# Patient Record
Sex: Male | Born: 1995 | Race: White | Hispanic: No | Marital: Single | Smoking: Current every day smoker
Health system: Southern US, Community
[De-identification: ages and names within clinical notes are randomized; demographics above are authoritative.]

---

## 2014-08-18 ENCOUNTER — Emergency Department (HOSPITAL_COMMUNITY)
Admission: EM | Admit: 2014-08-18 | Discharge: 2014-08-18 | Disposition: A | Discharge status: Home / Self Care | Payer: Self-pay | Attending: Emergency Medicine | Admitting: Emergency Medicine

## 2014-08-18 ENCOUNTER — Encounter (HOSPITAL_COMMUNITY): Payer: Self-pay | Admitting: Emergency Medicine

## 2014-08-18 DIAGNOSIS — R112 Nausea with vomiting, unspecified: Secondary | ICD-10-CM | POA: Insufficient documentation

## 2014-08-18 DIAGNOSIS — K2970 Gastritis, unspecified, without bleeding: Secondary | ICD-10-CM | POA: Insufficient documentation

## 2014-08-18 DIAGNOSIS — F17200 Nicotine dependence, unspecified, uncomplicated: Secondary | ICD-10-CM | POA: Insufficient documentation

## 2014-08-18 DIAGNOSIS — K2990 Gastroduodenitis, unspecified, without bleeding: Principal | ICD-10-CM

## 2014-08-18 LAB — COMPREHENSIVE METABOLIC PANEL
ALK PHOS: 43 U/L (ref 39–117)
ALT: 19 U/L (ref 0–53)
ANION GAP: 11 (ref 5–15)
AST: 20 U/L (ref 0–37)
Albumin: 4.2 g/dL (ref 3.5–5.2)
BUN: 10 mg/dL (ref 6–23)
CO2: 26 meq/L (ref 19–32)
Calcium: 9 mg/dL (ref 8.4–10.5)
Chloride: 105 mEq/L (ref 96–112)
Creatinine, Ser: 1.07 mg/dL (ref 0.50–1.35)
GFR calc Af Amer: >90 mL/min (ref >90)
GFR calc non Af Amer: >90 mL/min (ref >90)
Glucose, Bld: 92 mg/dL (ref 70–99)
POTASSIUM: 4.9 meq/L (ref 3.7–5.3)
Sodium: 142 mEq/L (ref 137–147)
TOTAL PROTEIN: 7 g/dL (ref 6.0–8.3)
Total Bilirubin: 0.4 mg/dL (ref 0.3–1.2)

## 2014-08-18 LAB — CBC WITH DIFFERENTIAL/PLATELET
BASOS ABS: 0 10*3/uL (ref 0.0–0.1)
BASOS PCT: 1 % (ref 0–1)
EOS ABS: 0.1 10*3/uL (ref 0.0–0.7)
Eosinophils Relative: 3 % (ref 0–5)
HCT: 42.4 % (ref 39.0–52.0)
HEMOGLOBIN: 14.4 g/dL (ref 13.0–17.0)
Lymphocytes Relative: 34 % (ref 12–46)
Lymphs Abs: 1.8 10*3/uL (ref 0.7–4.0)
MCH: 28.6 pg (ref 26.0–34.0)
MCHC: 34 g/dL (ref 30.0–36.0)
MCV: 84.3 fL (ref 78.0–100.0)
MONOS PCT: 9 % (ref 3–12)
Monocytes Absolute: 0.5 10*3/uL (ref 0.1–1.0)
NEUTROS PCT: 53 % (ref 43–77)
Neutro Abs: 2.9 10*3/uL (ref 1.7–7.7)
Platelets: 239 10*3/uL (ref 150–400)
RBC: 5.03 MIL/uL (ref 4.22–5.81)
RDW: 12.9 % (ref 11.5–15.5)
WBC: 5.3 10*3/uL (ref 4.0–10.5)

## 2014-08-18 LAB — LIPASE, BLOOD: Lipase: 63 U/L — ABNORMAL HIGH (ref 11–59)

## 2014-08-18 MED ORDER — ONDANSETRON 4 MG PO TBDP: 4.0000 mg | ORAL_TABLET | Freq: Three times a day (TID) | ORAL | Status: AC | PRN

## 2014-08-18 MED ORDER — RANITIDINE HCL 150 MG PO CAPS: 150.0000 mg | ORAL_CAPSULE | Freq: Every day | ORAL | Status: AC

## 2014-08-18 NOTE — ED Provider Notes (Signed)
I saw and evaluated the patient, reviewed the resident's note and I agree with the findings and plan.   EKG Interpretation   Date/Time:  Sunday August 18 2014 13:59:05 EDT Ventricular Rate:  84 PR Interval:  122 QRS Duration: 90 QT Interval:  328 QTC Calculation: 387 R Axis:   72 Text Interpretation:  Normal sinus rhythm Normal ECG Confirmed by Jarren Para,   DO, Spiro Ausborn ([id number]) on 08/18/2014 6:04:25 PM      Pt is a 18 y.o. M who presents emergency department 3 weeks of vomiting 30 minutes after eating. He states that he will have some blood-streaked sputum after multiple episodes of vomiting. No diarrhea. No bloody stool or melena. Denies any abdominal pain. Denies any heavy NSAID or alcohol use. No prior abdominal surgery. On exam, patient is well-appearing, smiling, nontoxic. Abdominal exam is completely benign. Lungs are clear to auscultation, heart sounds normal. Denies any chest pain or shortness of breath. No concern for Boerhaave's. Doubt perforated ulcer abdomen benign exam. No peritoneal signs on exam.No hematemesis likely due to esophageal irritation. I feel he will need an endoscopy as an outpatient. We'll start him on H2 blocker and have him followup with a PCP and gastroenterology. Have advised him of what food he should avoid. Have advised him to avoid NSAIDs and alcohol.  Layla Maw Riaz Onorato, DO 08/18/14 1825

## 2014-08-18 NOTE — ED Provider Notes (Signed)
CSN: [id number]     Arrival date & time 08/18/14  1344 History   First MD Initiated Contact with Patient 08/18/14 1726     Chief Complaint  Patient presents with  . Emesis  . Hematemesis  . Dizziness    Patient is a 18 y.o. male presenting with vomiting and dizziness. The history is provided by the patient.  Emesis Duration:  3 weeks Number of daily episodes:  2-3 Quality:  Undigested food (then at end, he notes scant bright red blood) Onset of vomiting after eating: 15-30 minutes. Progression:  Unchanged Chronicity:  New Recent urination:  Normal Relieved by:  None tried Worsened by:  Nothing tried Ineffective treatments:  None tried Associated symptoms: abdominal pain (after vomiting, but not after eating)   Associated symptoms: no chills, no cough, no diarrhea, no fever, no headaches and no URI   Risk factors: alcohol use (rare)   Risk factors: no prior abdominal surgery, no suspect food intake and no travel to endemic areas   Dizziness Context: standing up   Relieved by:  Being still Associated symptoms: vomiting   Associated symptoms: no chest pain, no diarrhea, no headaches, no nausea, no shortness of breath and no syncope    No significant NSAID/ alcohol use.  No pain currently.  No CP or SOB.  No urinary or testicle complaints  History reviewed. No pertinent past medical history. History reviewed. No pertinent past surgical history. No family history on file. History  Substance Use Topics  . Smoking status: Current Every Day Smoker  . Smokeless tobacco: Not on file  . Alcohol Use: No    Review of Systems  Constitutional: Negative for fever and chills.  Respiratory: Negative for cough, shortness of breath and wheezing.   Cardiovascular: Negative for chest pain and syncope.  Gastrointestinal: Positive for vomiting and abdominal pain (after vomiting, but not after eating). Negative for nausea and diarrhea.  Musculoskeletal: Negative for back pain.  Skin: Negative  for rash.  Neurological: Positive for dizziness. Negative for headaches.  All other systems reviewed and are negative.     Allergies  Review of patient's allergies indicates no known allergies.  Home Medications   Prior to Admission medications   Not on File   BP 138/73  Pulse 89  Temp(Src) 98.2 F (36.8 C) (Oral)  Resp 18  SpO2 100% Physical Exam  Nursing note and vitals reviewed. Constitutional: He is oriented to person, place, and time. He appears well-developed and well-nourished. No distress.  HENT:  Head: Normocephalic and atraumatic.  Nose: Nose normal.  Eyes: Conjunctivae are normal.  Neck: Normal range of motion. Neck supple. No tracheal deviation present.  Cardiovascular: Normal rate, regular rhythm and normal heart sounds.   No murmur heard. Pulmonary/Chest: Effort normal and breath sounds normal. No respiratory distress. He has no rales. He exhibits no tenderness.  Abdominal: Soft. Bowel sounds are normal. He exhibits no distension and no mass. There is no tenderness. There is no rebound and no guarding.  Neg Murphys, no CVAT, no McBurneys TTP  Genitourinary:  Normal testicle exam  Musculoskeletal: Normal range of motion. He exhibits no edema.  Neurological: He is alert and oriented to person, place, and time.  Skin: Skin is warm and dry. No rash noted. He is not diaphoretic.  Psychiatric: He has a normal mood and affect.    ED Course  Procedures (including critical care time) Labs Review Labs Reviewed  LIPASE, BLOOD - Abnormal; Notable for the following:  Lipase 63 (*)    All other components within normal limits  COMPREHENSIVE METABOLIC PANEL  CBC WITH DIFFERENTIAL  URINALYSIS, ROUTINE W REFLEX MICROSCOPIC    Imaging Review No results found.   EKG Interpretation   Date/Time:  Sunday August 18 2014 13:59:05 EDT Ventricular Rate:  84 PR Interval:  122 QRS Duration: 90 QT Interval:  328 QTC Calculation: 387 R Axis:   72 Text  Interpretation:  Normal sinus rhythm Normal ECG Confirmed by WARD,   DO, KRISTEN ([id number]) on 08/18/2014 6:04:25 PM      MDM   Final diagnoses:  Gastritis  Non-intractable vomiting with nausea, vomiting of unspecified type    Notes emesis after eating for past 3 weeks.  Currently asymptomatic and well appearing, doubt esophageal rupture/ Booerhaves.  May have had small esophageal mucosal tears from persistent vomiting.  No signs of systemic toxicity or peritonitis to suggest surgical emergency.  Lipase borderline high but may be related to vomiting.   Labs unremarkable, no sign of gallbladder disease.  Notes orthostasis, no signs of anemia or electrolyte derangement.   Could have gastritis vs PUD, doubt perforated ulcer.  Will treat with zantac. F/u with PCP and GI, likely need endoscopy.     Sofie Rower, MD 08/18/14 [id number]

## 2014-08-18 NOTE — Discharge Instructions (Signed)
Gastritis, Adult °Gastritis is soreness and puffiness (inflammation) of the lining of the stomach. If you do not get help, gastritis can cause bleeding and sores (ulcers) in the stomach. °HOME CARE  °· Only take medicine as told by your doctor. °· If you were given antibiotic medicines, take them as told. Finish the medicines even if you start to feel better. °· Drink enough fluids to keep your pee (urine) clear or pale yellow. °· Avoid foods and drinks that make your problems worse. Foods you may want to avoid include: °¨ Caffeine or alcohol. °¨ Chocolate. °¨ Mint. °¨ Garlic and onions. °¨ Spicy foods. °¨ Citrus fruits, including oranges, lemons, or limes. °¨ Food containing tomatoes, including sauce, chili, salsa, and pizza. °¨ Fried and fatty foods. °· Eat small meals throughout the day instead of large meals. °GET HELP RIGHT AWAY IF:  °· You have black or dark red poop (stools). °· You throw up (vomit) blood. It may look like coffee grounds. °· You cannot keep fluids down. °· Your belly (abdominal) pain gets worse. °· You have a fever. °· You do not feel better after 1 week. °· You have any other questions or concerns. °MAKE SURE YOU:  °· Understand these instructions. °· Will watch your condition. °· Will get help right away if you are not doing well or get worse. °Document Released: 05/17/2008 Document Revised: 02/21/2012 Document Reviewed: 01/12/2012 °ExitCare® Patient Information ©2015 [employer]. This information is not intended to replace advice given to you by your health care provider. Make sure you discuss any questions you have with your health care provider. ° ° °Emergency Department Resource Guide °1) Find a Doctor and Pay Out of Pocket °Although you won't have to find out who is covered by your insurance plan, it is a good idea to ask around and get recommendations. You will then need to call the office and see if the doctor you have chosen will accept you as a new patient and what types of  options they offer for patients who are self-pay. Some doctors offer discounts or will set up payment plans for their patients who do not have insurance, but you will need to ask so you aren't surprised when you get to your appointment. ° °2) Contact Your Local Health Department °Not all health departments have doctors that can see patients for sick visits, but many do, so it is worth a call to see if yours does. If you don't know where your local health department is, you can check in your phone book. The CDC also has a tool to help you locate your state's health department, and many state websites also have listings of all of their local health departments. ° °3) Find a Walk-in Clinic °If your illness is not likely to be very severe or complicated, you may want to try a walk in clinic. These are popping up all over the country in pharmacies, drugstores, and shopping centers. They're usually staffed by nurse practitioners or physician assistants that have been trained to treat common illnesses and complaints. They're usually fairly quick and inexpensive. However, if you have serious medical issues or chronic medical problems, these are probably not your best option. ° °No Primary Care Doctor: °- Call [facility] at  [phone] - they can help you locate a primary care doctor that  accepts your insurance, provides certain services, etc. °- Physician Referral Service- 1-800-533-3463 ° °Chronic Pain Problems: °Organization         Address    Phone   Notes  °[facility]  ([phone]) [phone] Patients need to be referred by their primary care doctor.  ° °Medication Assistance: °Organization         Address  Phone   Notes  °[place] [facility] [address] °[place], [place] [address] ([phone]) 641-8030 --Must be a resident of [place] °-- Must have NO insurance coverage whatsoever (no Medicaid/ Medicare, etc.) °-- The pt. MUST have a primary care doctor that directs  their care regularly and follows them in the community °  °[facility]  (866) 331-1348   °[employer]  (888) 892-1162   ° °Agencies that provide inexpensive medical care: °Organization         Address  Phone   Notes  °[facility]  ([phone]) [phone]   °[facility]    ([phone]) 832-7272   °[facility] [address] °[place], [place] [address] ([phone]) 832-4777   °[facility] [address], °[place] (336) 271-4999   °[facility]    (336) 373-0678   °[facility]    (336) 272-1050   °[facility] ° [address], [place] Phone:  (336) 832-4444, Fax:  ([phone]) [phone] Hours of Operation:  9 am - 6 pm, M-F.  Also accepts Medicaid/Medicare and self-pay.  °[facility] ° [address], [place] Phone: ([phone]) [phone], Fax: ([phone]) [phone]. Hours of Operation:  8:30 am - 5:30 pm, M-F.  Also accepts Medicaid and self-pay.  °[facility] [place] [address], [place] Phone: ([phone]) [phone]   °[facility] [address], [place], [place] (336)[phone], Ext. 123 Mondays & Thursdays: 7-9 AM.  First 15 patients are seen on a first come, first serve basis. °  ° °Medicaid-accepting [place] Providers: ° °Organization         Address  Phone   Notes  °[facility] [address], [place] ([phone]) [phone] Also accepts self-pay patients.  °[facility] [address], [place] ° (336) [phone]   °[facility] [address], [place] ([phone]) [phone]   °[facility] [address] [place] [address], [place] (336) [phone]   °[facility] [address], [place]  ° ([phone]) [phone] Only accepts [place] Access Medicaid patients after they have their name applied to their card.  ° °Self-Pay (no insurance) in [place]: ° °Organization          Address  Phone   Notes  °Sickle Cell Patients, [facility] [address], [place] (336) [phone]   °[facility] [address], [place] (336) [phone]   °[facility] ° [address], [place] (336) [phone]   °[facility]/Dr. Osei-Bonsu ° [address] [place] [address], [place] or [address], [place] [phone]) [phone] Phone number for both [place] and [place] locations is the same.  °[facility] [address], [place] ([phone]) [phone]   °[facility]  [address] [place] [address],  or [address] ([phone]) [phone] °(336) 878-2260   °[facility] [address],  (336) 350-1642, phone; ([phone]) [phone], fax Sees patients 1st and 3rd Saturday of every month.  Must not qualify for public   or private insurance (i.e. Medicaid, Medicare, [place] Health Choice, Veterans' Benefits)  Household income should be no more than 200% of the poverty level The clinic cannot treat you if you are pregnant or think you are pregnant  Sexually transmitted diseases are not treated at the clinic.    Dental Care: Organization         Address  Phone  Notes  [facility] [address] [place], [place] [phone] Accepts children up to age 74 who are enrolled in [place] or [place] Health Choice; pregnant women with a Medicaid card; and children who have applied for Medicaid or [place] Health Choice, but were declined, whose parents can pay a reduced fee at time of service.  [facility] of [facility]  [address], [place] [phone] Accepts children up to age 11 who are enrolled in [place] or [place] Health Choice; pregnant women with a Medicaid card; and children who have applied for Medicaid or [place] Health Choice, but were declined, whose parents can pay a reduced fee at time of  service.  [facility]  [address] [place], [place] [phone] Patients are seen by appointment only. Walk-ins are not accepted. [facility] will see patients 87 years of age and older. Monday - Tuesday (8am-5pm) Most Wednesdays (8:30-5pm) $30 per visit, cash only  [facility]  [address], [facility] [phone] Patients are seen by appointment only. Walk-ins are not accepted. [facility] will see patients 67 years of age and older. One Wednesday Evening (Monthly: Volunteer Based).  $30 per visit, cash only  [employer]  (214)560-8617 for adults; Children under age 13, call Graduate Pediatric Dentistry at [phone]. Children aged 57-14, please call [phone] to request a pediatric application.  Dental services are provided in all areas of dental care including fillings, crowns and bridges, complete and partial dentures, implants, gum treatment, root canals, and extractions. Preventive care is also provided. Treatment is provided to both adults and children. Patients are selected via a lottery and there is often a waiting list.   [facility] [address], [place]  [phone] [email or web address]   [facility] [address] [place], [place] [phone], Ext. [phone] Second and Fourth Thursday of each month, opens at 6:30 AM; Clinic ends at 9 AM.  Patients are seen on a first-come first-served basis, and a limited number are seen during each clinic.   [facility]  [address] [place], [place] [phone]   Eligibility Requirements You must have lived in [place], [place], or [place] counties for at least the last three months.   You cannot be eligible for state or federal sponsored [place], including [employer], [place], or [employer].   You generally cannot be eligible for healthcare insurance through your employer.     How to apply: Eligibility screenings are held every Tuesday and Wednesday afternoon from 1:00 pm until 4:00 pm. You do not need an appointment for the interview!  [facility] [address], [place], [place] [phone]   [facility]  [phone]   [facility]  [phone]   [facility]  [phone]

## 2014-08-18 NOTE — ED Notes (Signed)
Pt states for the last month he has been vomiting after he eats everytime and at the end there is some blood.  Pt states for the last year he has been having his heart rate get fast and when he stands feels like he is going to pass out , but not today.  Pt reports left upper quad pain

## 2014-08-18 NOTE — ED Notes (Signed)
Pt given sprite for PO challenge

## 2014-08-22 ENCOUNTER — Emergency Department (HOSPITAL_COMMUNITY): Payer: Self-pay

## 2014-08-22 ENCOUNTER — Encounter (HOSPITAL_COMMUNITY): Payer: Self-pay | Admitting: Emergency Medicine

## 2014-08-22 ENCOUNTER — Emergency Department (HOSPITAL_COMMUNITY)
Admission: EM | Admit: 2014-08-22 | Discharge: 2014-08-22 | Disposition: A | Discharge status: Home / Self Care | Payer: Self-pay | Attending: Emergency Medicine | Admitting: Emergency Medicine

## 2014-08-22 DIAGNOSIS — Z79899 Other long term (current) drug therapy: Secondary | ICD-10-CM | POA: Insufficient documentation

## 2014-08-22 DIAGNOSIS — Z7982 Long term (current) use of aspirin: Secondary | ICD-10-CM | POA: Insufficient documentation

## 2014-08-22 DIAGNOSIS — K219 Gastro-esophageal reflux disease without esophagitis: Secondary | ICD-10-CM | POA: Insufficient documentation

## 2014-08-22 DIAGNOSIS — F17200 Nicotine dependence, unspecified, uncomplicated: Secondary | ICD-10-CM | POA: Insufficient documentation

## 2014-08-22 DIAGNOSIS — R1111 Vomiting without nausea: Secondary | ICD-10-CM

## 2014-08-22 DIAGNOSIS — R109 Unspecified abdominal pain: Secondary | ICD-10-CM | POA: Insufficient documentation

## 2014-08-22 LAB — URINE MICROSCOPIC-ADD ON

## 2014-08-22 LAB — URINALYSIS, ROUTINE W REFLEX MICROSCOPIC
Bilirubin Urine: NEGATIVE
Glucose, UA: NEGATIVE mg/dL
Hgb urine dipstick: NEGATIVE
Ketones, ur: NEGATIVE mg/dL
Leukocytes, UA: NEGATIVE
Nitrite: NEGATIVE
PH: 7 (ref 5.0–8.0)
PROTEIN: NEGATIVE mg/dL
Specific Gravity, Urine: 1.024 (ref 1.005–1.030)
Urobilinogen, UA: 0.2 mg/dL (ref 0.0–1.0)

## 2014-08-22 LAB — CBC WITH DIFFERENTIAL/PLATELET
BASOS PCT: 0 % (ref 0–1)
Basophils Absolute: 0 10*3/uL (ref 0.0–0.1)
Eosinophils Absolute: 0.2 10*3/uL (ref 0.0–0.7)
Eosinophils Relative: 3 % (ref 0–5)
HCT: 43 % (ref 39.0–52.0)
HEMOGLOBIN: 14.8 g/dL (ref 13.0–17.0)
LYMPHS ABS: 2.6 10*3/uL (ref 0.7–4.0)
LYMPHS PCT: 36 % (ref 12–46)
MCH: 28.4 pg (ref 26.0–34.0)
MCHC: 34.4 g/dL (ref 30.0–36.0)
MCV: 82.4 fL (ref 78.0–100.0)
MONOS PCT: 10 % (ref 3–12)
Monocytes Absolute: 0.7 10*3/uL (ref 0.1–1.0)
NEUTROS ABS: 3.7 10*3/uL (ref 1.7–7.7)
NEUTROS PCT: 51 % (ref 43–77)
Platelets: 269 10*3/uL (ref 150–400)
RBC: 5.22 MIL/uL (ref 4.22–5.81)
RDW: 12.9 % (ref 11.5–15.5)
WBC: 7.2 10*3/uL (ref 4.0–10.5)

## 2014-08-22 LAB — COMPREHENSIVE METABOLIC PANEL
ALBUMIN: 4.2 g/dL (ref 3.5–5.2)
ALT: 22 U/L (ref 0–53)
ANION GAP: 12 (ref 5–15)
AST: 20 U/L (ref 0–37)
Alkaline Phosphatase: 45 U/L (ref 39–117)
BUN: 14 mg/dL (ref 6–23)
CALCIUM: 9.1 mg/dL (ref 8.4–10.5)
CO2: 26 mEq/L (ref 19–32)
Chloride: 100 mEq/L (ref 96–112)
Creatinine, Ser: 0.99 mg/dL (ref 0.50–1.35)
GFR calc Af Amer: >90 mL/min (ref >90)
GFR calc non Af Amer: >90 mL/min (ref >90)
Glucose, Bld: 89 mg/dL (ref 70–99)
Potassium: 4 mEq/L (ref 3.7–5.3)
SODIUM: 138 meq/L (ref 137–147)
TOTAL PROTEIN: 7.1 g/dL (ref 6.0–8.3)
Total Bilirubin: 0.3 mg/dL (ref 0.3–1.2)

## 2014-08-22 LAB — LIPASE, BLOOD: Lipase: 51 U/L (ref 11–59)

## 2014-08-22 MED ORDER — OMEPRAZOLE 20 MG PO CPDR: 20.0000 mg | DELAYED_RELEASE_CAPSULE | Freq: Every day | ORAL | Status: AC

## 2014-08-22 MED ORDER — ONDANSETRON HCL 4 MG/2ML IJ SOLN
4.0000 mg | Freq: Once | INTRAMUSCULAR | Status: AC
  Administered 2014-08-22: 4 mg via INTRAVENOUS
  Filled 2014-08-22: qty 2

## 2014-08-22 MED ORDER — IOHEXOL 300 MG/ML  SOLN: 25.0000 mL | Freq: Once | INTRAMUSCULAR | Status: DC | PRN

## 2014-08-22 MED ORDER — IOHEXOL 300 MG/ML  SOLN
100.0000 mL | Freq: Once | INTRAMUSCULAR | Status: AC | PRN
  Administered 2014-08-22: 100 mL via INTRAVENOUS

## 2014-08-22 MED ORDER — PANTOPRAZOLE SODIUM 40 MG IV SOLR
40.0000 mg | Freq: Once | INTRAVENOUS | Status: AC
  Administered 2014-08-22: 40 mg via INTRAVENOUS
  Filled 2014-08-22: qty 40

## 2014-08-22 NOTE — Discharge Instructions (Signed)
°Emergency Department Resource Guide °1) Find a Doctor and Pay Out of Pocket °Although you won't have to find out who is covered by your insurance plan, it is a good idea to ask around and get recommendations. You will then need to call the office and see if the doctor you have chosen will accept you as a new patient and what types of options they offer for patients who are self-pay. Some doctors offer discounts or will set up payment plans for their patients who do not have insurance, but you will need to ask so you aren't surprised when you get to your appointment. ° °2) Contact Your Local Health Department °Not all health departments have doctors that can see patients for sick visits, but many do, so it is worth a call to see if yours does. If you don't know where your local health department is, you can check in your phone book. The CDC also has a tool to help you locate your state's health department, and many state websites also have listings of all of their local health departments. ° °3) Find a Walk-in Clinic °If your illness is not likely to be very severe or complicated, you may want to try a walk in clinic. These are popping up all over the country in pharmacies, drugstores, and shopping centers. They're usually staffed by nurse practitioners or physician assistants that have been trained to treat common illnesses and complaints. They're usually fairly quick and inexpensive. However, if you have serious medical issues or chronic medical problems, these are probably not your best option. ° °No Primary Care Doctor: °- Call [facility] at  832-8000 - they can help you locate a primary care doctor that  accepts your insurance, provides certain services, etc. °- Physician Referral Service- 1-800-533-3463 ° °Chronic Pain Problems: °Organization         Address  Phone   Notes  °[facility]  (336) 297-2271 Patients need to be referred by their primary care doctor.  ° °Medication  Assistance: °Organization         Address  Phone   Notes  °[place] [facility] [address] °[place], [place] [address] (336) 641-8030 --Must be a resident of [place] °-- Must have NO insurance coverage whatsoever (no Medicaid/ Medicare, etc.) °-- The pt. MUST have a primary care doctor that directs their care regularly and follows them in the community °  °[facility]  (866) 331-1348   °[employer]  (888) 892-1162   ° °Agencies that provide inexpensive medical care: °Organization         Address  Phone   Notes  °[facility]  (336) 832-8035   °[facility]    (336) 832-7272   °[facility] [address] °[place], [place] [address] (336) 832-4777   °[facility] [address], °[place] (336) 271-4999   °[facility]    (336) 373-0678   °[facility]    (336) 272-1050   °[facility] ° [address], [place] Phone:  (336) 832-4444, Fax:  (336) 832-4440 Hours of Operation:  9 am - 6 pm, M-F.  Also accepts Medicaid/Medicare and self-pay.  °[facility] ° [address], [place] Phone: (336) 832-3150, Fax: (336) [phone]. Hours of Operation:  8:30 am - 5:30 pm, M-F.  Also accepts Medicaid and self-pay.  °[facility] [place] [address], [place] Phone: (336) 878-6027   °[facility] [address], [place], [place] (336)723-1848, Ext. 123 Mondays & Thursdays: 7-9 AM.  First 15 patients are seen on a first come, first serve basis. °  ° °Medicaid-accepting [place] Providers: ° °Organization         Address  Phone   Notes  °[facility] [address], [place] (336) 641-2100 Also accepts self-pay patients.  °[facility] [address], [place] ° (336) 856-9996   °[facility] [address], [place]  (336) 288-8857   °[facility] [address] [place] [address], [place] (336) 299-7000   °Veita Bland [address], [place]  ° (336) 373-1557 Only accepts [place] Access Medicaid patients after they have their name applied to their card.  ° °Self-Pay (no insurance) in [place]: ° °Organization         Address  Phone   Notes  °Sickle Cell Patients, [facility] [address], [place] (336) 832-1970   °[facility] [address], [place] (336) 832-4400   °[facility] ° [address], [place] (336) 992-4800   °[facility]/Dr. Osei-Bonsu ° [address] [place] [address], [place] or [address], [place] (336) [phone] Phone number for both [place] and [place] locations is the same.  °[facility] [address], [place] (336) 299-0000   °[facility] [address] [place] [address], [place] or [address] (336) 852-7530 °(336) 878-2260   °[facility] [address], [place] (336) 350-1642, phone; (336) 294-5005, fax Sees patients 1st and 3rd Saturday of every month.  Must not qualify for public or private insurance (i.e. Medicaid, Medicare, [place] Health Choice, Veterans' Benefits) • Household income should be no more than 200% of the poverty level •The clinic cannot treat you if you are pregnant or think you are pregnant • Sexually transmitted diseases are not treated at the clinic.  ° ° °Dental Care: °Organization         Address  Phone  Notes  °[place] [facility] [address], [place] (336) 641-6152 Accepts children up to age 21 who are enrolled in Medicaid or [place] Health Choice; pregnant women with a Medicaid card; and children who have applied for Medicaid or [place] Health Choice, but were declined, whose parents can pay a reduced fee at time of service.  °[place]  [facility] [place]  [address], [place] (336) 641-7733 Accepts children up to age 21 who are enrolled in Medicaid or [place] Health Choice; pregnant women with a Medicaid card; and children who have applied for Medicaid or [place] Health Choice, but were declined, whose parents can pay a reduced fee at time of service.  °[facility] ° [address], [place] (336) 641-4533 Patients are seen by appointment only. Walk-ins are not accepted. [facility] will see patients 18 years of age and older. °Monday - Tuesday (8am-5pm) °Most Wednesdays (8:30-5pm) °$30 per visit, cash only  °[facility] ° [address], [place] (336) 641-4533 Patients are seen by appointment only. Walk-ins are not accepted. [facility] will see patients 18 years of age and older. °One   Wednesday Evening (Monthly: Volunteer Based).  $30 per visit, cash only  °[facility]  (919) 537-3737 for adults; Children under age 4, call Graduate Pediatric Dentistry at ([phone]) [phone]. Children aged 4-14, please call (919) [phone] to request a pediatric application. ° Dental services are provided in all areas of dental care including fillings, crowns and bridges, complete and partial dentures, implants, gum treatment, root canals, and extractions. Preventive care is also provided. Treatment is provided to both adults and children. °Patients are selected via a lottery and there is often a waiting list. °  °[facility] [address], °[place] ° (336) 763-8833 [email or web address] °  °[facility] [address], [place], [place] (336)723-1848, Ext. 123 Second and Fourth Thursday of each month, opens at 6:30 AM; Clinic ends at 9 AM.  Patients are seen on a first-come first-served basis, and a limited number are seen during each clinic.  ° °[facility] ° [address], [place], [place] (336) [phone]    Eligibility Requirements °You must have lived in [place], [place], or [place] counties for at least the last three months. °  You cannot be eligible for state or federal sponsored healthcare insurance, including Veterans Administration, Medicaid, or Medicare. °  You generally cannot be eligible for healthcare insurance through your employer.  °  How to apply: °Eligibility screenings are held every Tuesday and Wednesday afternoon from 1:00 pm until 4:00 pm. You do not need an appointment for the interview!  °[facility] [address], [place], [place] 336-631-2330   °[facility]  336-342-8273   °[facility]  336-703-3100   °[facility]  336-570-6415   ° °Behavioral Health Resources in the Community: °Intensive Outpatient Programs °Organization         Address  Phone  Notes  °[facility] [address], [place], [place] 336-878-6098   °[facility] [address], [place], [place] 336-832-9800   °[facility] [address], [place], [place] ° 336-882-2125   °[facility] [address],  °[place], [place] 1-800-853-5163 or 336-641-4981   °Substance Abuse Resources °Organization         Address  Phone  Notes  °[facility]  336-882-2125   °[facility]  336-784-9470   °[facility]  336-285-9073   °[facility]  336-845-3988   °Residential & Outpatient Substance Abuse Program  1-800-659-3381   °Psychological Services °Organization         Address  Phone  Notes  °[facility]  336- 832-9600   °[facility]  336- 378-7881   °[facility] [address], [place] 1-800-853-5163 or 336-641-4981   ° °Mobile Crisis Teams °Organization         Address  Phone  Notes  °[facility], Mobile Crisis Care Unit  1-877-626-1772   °Assertive °[facility] ° [address]  [place], [place] 336-834-9664   °Sharon DeEsch [address] °[place] 336-554-5454   ° °Self-Help/Support Groups °Organization         Address  Phone             Notes  °[facility]  - variety of support groups  336- 373-1402 Call for more information  °Narcotics Anonymous (NA), [facility] [address], °[place]  2 meetings at this location  ° °  Residential Treatment Programs Organization         Address  Phone  Notes  [facility] [address]    [place]  1-308-657-8469   [facility]  [address] [place] [address], [place], [place] 413-244-0102   [facility] [address] [place], [place] 725-366-4403 Admissions: 8am-3pm M-F  [facility] [address]    [place], [place] 474-259-5638   [facility] [address] [place], [place], [place] 756-433-2951   [facility] [address],  [place], [place] [phone]   [facility] Intensive Outpatient [address], [place], [place] 160-109-3235   [facility] ([facility] [address] [place].,  [place], [place] 5-732-202-5427 or 952-406-5978   [facility] ([facility] [address] [place], [place] 517-616-0737 Accepts Medicaid  [facility] [address]  [place] 1-062-694-8546 Substance Abuse/Addiction Treatment   [facility]         Address  Phone  Notes  [facility]  281 110 6244   Angie Fava, PhD [address] [place] [place], [place]   6607852027 or 640 691 8021   [facility]   [address] [place], [place] 364-061-8475   [facility] [address], [place], [place] [phone] Insurance/Medicaid/sponsorship through [facility] [address]                                    [place], [place] [phone] Therapy/tele-psych/case    [facility] [address], [place] [phone]    Dr. Lolly Mustache  [phone]   [facility]  [employer] [facility] Dept. 1) [address], [place] 2) [address], [place] 3)  [address], [place] (514)[phone]  (331) [phone]   [facility] Child Abuse Hotline (307)226-4351 or (909)615-1900 (After Hours)     Gastroesophageal Reflux Disease, Adult Gastroesophageal reflux disease (GERD) happens when acid from your stomach flows up into the esophagus. When acid comes in contact with the esophagus, the acid causes soreness (inflammation) in the esophagus. Over time, GERD may create small holes (ulcers) in the lining of the esophagus. CAUSES   Increased body weight. This puts pressure on the stomach, making acid rise from the stomach into the esophagus.  Smoking. This increases acid production in the stomach.  Drinking alcohol. This causes decreased pressure in the lower esophageal sphincter (valve or ring of muscle between the esophagus and stomach), allowing acid from the stomach into the esophagus.  Late evening meals and a full stomach. This increases pressure and acid production in the stomach.  A malformed lower esophageal sphincter. Sometimes, no cause is found. SYMPTOMS   Burning pain in the lower part of the mid-chest behind the breastbone and in the mid-stomach area. This may occur twice a week or more often.  Trouble swallowing.  Sore throat.  Dry cough.  Asthma-like symptoms including chest tightness, shortness of breath, or wheezing. DIAGNOSIS  Your caregiver may be able to diagnose GERD based on your symptoms. In some cases, X-rays and other tests may be done to check for complications or to check the condition of your stomach and esophagus. TREATMENT  Your caregiver may recommend over-the-counter or prescription  medicines to help decrease acid production. Ask your caregiver before  starting or adding any new medicines.  HOME CARE INSTRUCTIONS   Change the factors that you can control. Ask your caregiver for guidance concerning weight loss, quitting smoking, and alcohol consumption.  Avoid foods and drinks that make your symptoms worse, such as:  Caffeine or alcoholic drinks.  Chocolate.  Peppermint or mint flavorings.  Garlic and onions.  Spicy foods.  Citrus fruits, such as oranges, lemons, or limes.  Tomato-based foods such as sauce, chili, salsa, and pizza.  Fried and fatty foods.  Avoid lying down for the 3 hours prior to your bedtime or prior to taking a nap.  Eat small, frequent meals instead of large meals.  Wear loose-fitting clothing. Do not wear anything tight around your waist that causes pressure on your stomach.  Raise the head of your bed 6 to 8 inches with wood blocks to help you sleep. Extra pillows will not help.  Only take over-the-counter or prescription medicines for pain, discomfort, or fever as directed by your caregiver.  Do not take aspirin, ibuprofen, or other nonsteroidal anti-inflammatory drugs (NSAIDs). SEEK IMMEDIATE MEDICAL CARE IF:   You have pain in your arms, neck, jaw, teeth, or back.  Your pain increases or changes in intensity or duration.  You develop nausea, vomiting, or sweating (diaphoresis).  You develop shortness of breath, or you faint.  Your vomit is green, yellow, black, or looks like coffee grounds or blood.  Your stool is red, bloody, or black. These symptoms could be signs of other problems, such as heart disease, gastric bleeding, or esophageal bleeding. MAKE SURE YOU:   Understand these instructions.  Will watch your condition.  Will get help right away if you are not doing well or get worse. Document Released: 09/08/2005 Document Revised: 02/21/2012 Document Reviewed: 06/18/2011 [facility] Patient Information 2015 [place], [place]. This information is not intended to replace advice given to  you by your health care provider. Make sure you discuss any questions you have with your health care provider.

## 2014-08-22 NOTE — [employer] Notes (Signed)
[facility] [employer],   Provided pt with a list of primary care resources, [employer], and [employer].

## 2014-08-22 NOTE — ED Provider Notes (Signed)
CSN: [id number]     Arrival date & time 08/22/14  1400 History   First MD Initiated Contact with Patient 08/22/14 1745     Chief Complaint  Patient presents with  . Abdominal Pain     (Consider location/radiation/quality/duration/timing/severity/associated sxs/prior Treatment) HPI This patient presents with a 3-4 week history of epigastric pain and vomiting. The patient reports that after he eats about 30-40 minutes later he starts vomiting. He has not had problems with his bowel movements or any diarrhea. He describes an aching sharp epigastric pain that then follows this. Gradually that starts to go away. He doesn't seem a particular food triggers that he reports he can be anything that he might eat or drink. The patient reports he has had weight loss but doesn't quantify. He denies fever chills or other general constitutional symptoms.  History reviewed. No pertinent past medical history. History reviewed. No pertinent past surgical history. No family history on file. History  Substance Use Topics  . Smoking status: Current Every Day Smoker  . Smokeless tobacco: Not on file  . Alcohol Use: No    Review of Systems  10 Systems reviewed and are negative for acute change except as noted in the HPI.   Allergies  Review of patient's allergies indicates no known allergies.  Home Medications   Prior to Admission medications   Medication Sig Start Date End Date Taking? Authorizing Provider  Aspirin-Acetaminophen-Caffeine (EXCEDRIN EXTRA STRENGTH PO) Take 2 tablets by mouth.   Yes Historical Provider, MD  omeprazole (PRILOSEC) 20 MG capsule Take 1 capsule (20 mg total) by mouth daily. 08/22/14   Arby Barrette, MD  ondansetron (ZOFRAN ODT) 4 MG disintegrating tablet Take 1 tablet (4 mg total) by mouth every 8 (eight) hours as needed for nausea or vomiting. 08/18/14   Sofie Rower, MD  ranitidine (ZANTAC) 150 MG capsule Take 1 capsule (150 mg total) by mouth daily. 08/18/14   Sofie Rower,  MD   BP 120/61  Pulse 63  Temp(Src) 98 F (36.7 C) (Oral)  Resp 16  SpO2 100% Physical Exam  Constitutional: He is oriented to person, place, and time. He appears well-developed and well-nourished.  HENT:  Head: Normocephalic and atraumatic.  Eyes: EOM are normal. Pupils are equal, round, and reactive to light.  Neck: Neck supple.  Cardiovascular: Normal rate, regular rhythm, normal heart sounds and intact distal pulses.   Pulmonary/Chest: Effort normal and breath sounds normal.  Abdominal: Soft. Bowel sounds are normal. He exhibits no distension. There is no tenderness.  Musculoskeletal: Normal range of motion. He exhibits no edema.  Neurological: He is alert and oriented to person, place, and time. He has normal strength. Coordination normal. GCS eye subscore is 4. GCS verbal subscore is 5. GCS motor subscore is 6.  Skin: Skin is warm, dry and intact.  Psychiatric: He has a normal mood and affect.     ED Course  Procedures (including critical care time) Labs Review Labs Reviewed  URINALYSIS, ROUTINE W REFLEX MICROSCOPIC - Abnormal; Notable for the following:    APPearance TURBID (*)    All other components within normal limits  URINE MICROSCOPIC-ADD ON - Abnormal; Notable for the following:    Bacteria, UA FEW (*)    All other components within normal limits  COMPREHENSIVE METABOLIC PANEL  CBC WITH DIFFERENTIAL  LIPASE, BLOOD    Imaging Review Ct Abdomen Pelvis W Contrast  08/22/2014   CLINICAL DATA:  Left-sided abdominal pain.  Nausea and vomiting.  EXAM: CT ABDOMEN  AND PELVIS WITH CONTRAST  TECHNIQUE: Multidetector CT imaging of the abdomen and pelvis was performed using the standard protocol following bolus administration of intravenous contrast.  CONTRAST:  OMNIPAQUE IOHEXOL 300 MG/ML  SOLN  COMPARISON:  None.  FINDINGS: Liver:  No masses or other significant abnormality identified.  Gallbladder/Biliary:  Unremarkable.  Pancreas: No mass, inflammatory changes, or  other parenchymal abnormality identified.  Spleen:  Within normal limits in size and appearance.  Adrenal Glands:  No mass identified.  Kidneys/Urinary Tract: No masses identified. No evidence of hydronephrosis.  Lymph Nodes:  No pathologically enlarged lymph nodes identified.  Pelvic/Reproductive Organs: No mass or other significant abnormality identified.  Bowel/Peritoneum: No evidence of bowel wall thickening, mass, or obstruction.  Vascular:  No evidence of abdominal aortic aneurysm.  Musculoskeletal:  No suspicious bone lesions identified.  Other:  None.  IMPRESSION: Negative.   Electronically Signed   By: Myles Rosenthal M.D.   On: 08/22/2014 20:37     EKG Interpretation None      MDM   Final diagnoses:  Gastroesophageal reflux disease without esophagitis  Non-intractable vomiting without nausea, vomiting of unspecified type   At this time for the workup is negative. CT does not show any acute obstructive pattern or other surgical etiology. Patient will be treated for reflux gastritis type symptoms. He apparently was unable to afford the medications previously prescribed and has been counseled for over-the-counter Prilosec and taking that. He is also counseled on the need to get outpatient followup and probable endoscopy.    Arby Barrette, MD 08/22/14 2056

## 2014-08-22 NOTE — ED Notes (Signed)
Pt aware urine sample is needed 

## 2014-08-22 NOTE — ED Notes (Signed)
Pt c/o abd pain x 1 month, states 15-30 mins after eating he has abd pain and vomiting. States there is blood in vomit too. Pt was seen at [facility] 9/6 and diagnosed with gastritis.

## 2016-04-01 IMAGING — CT CT ABD-PELV W/ CM
1 series · 15 of 32 positions shown, 19 images · IV contrast (OMNIPAQUE 300)
Comparison: None.

CLINICAL DATA: Left-sided abdominal pain.  Nausea and vomiting.

EXAM:
CT ABDOMEN AND PELVIS WITH CONTRAST
TECHNIQUE: Multidetector CT imaging of the abdomen and pelvis was performed
using the standard protocol following bolus administration of
intravenous contrast.
CONTRAST:  100mL OMNIPAQUE IOHEXOL 300 MG/ML  SOLN

[Series 2: abd/pel with · axial · 0.74mm/px · z∈[+1104,+1564]mm · 15 of 103 slices shown, 19 images]
[im 7/103  soft-tissue]
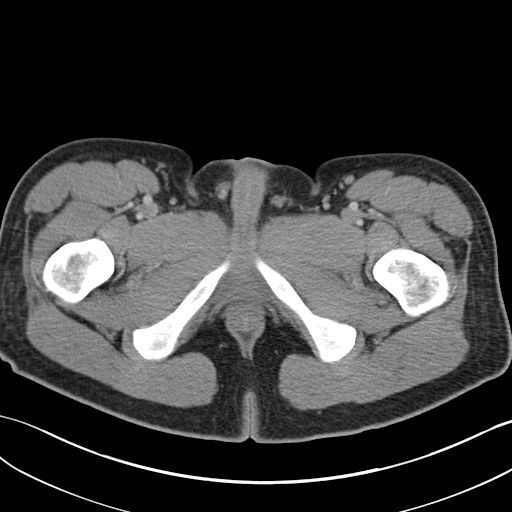
[im 7/103  bone]
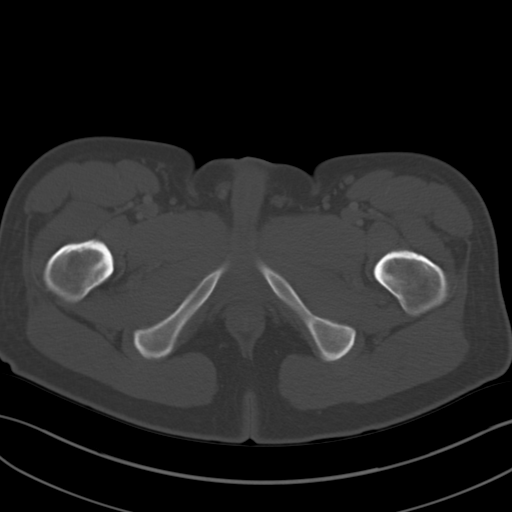
[im 14/103  soft-tissue]
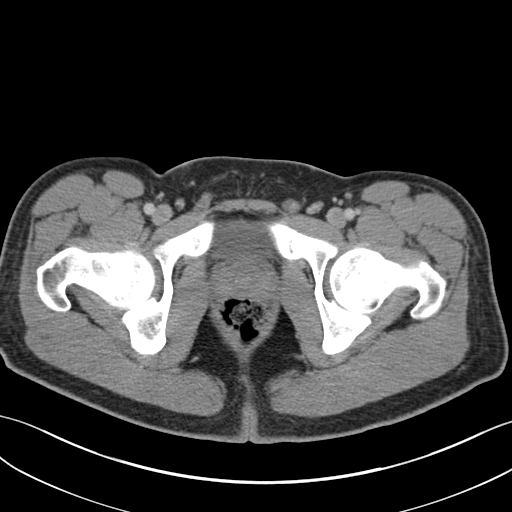
[im 20/103  soft-tissue]
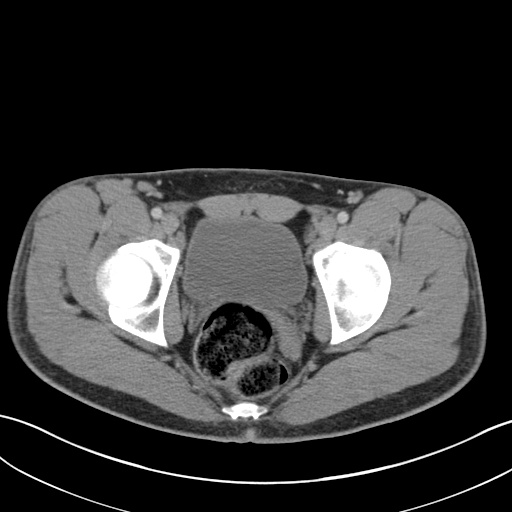
[im 30/103  soft-tissue]
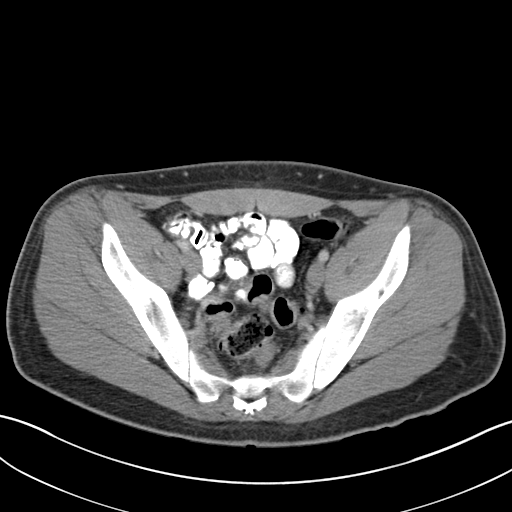
[im 37/103  soft-tissue]
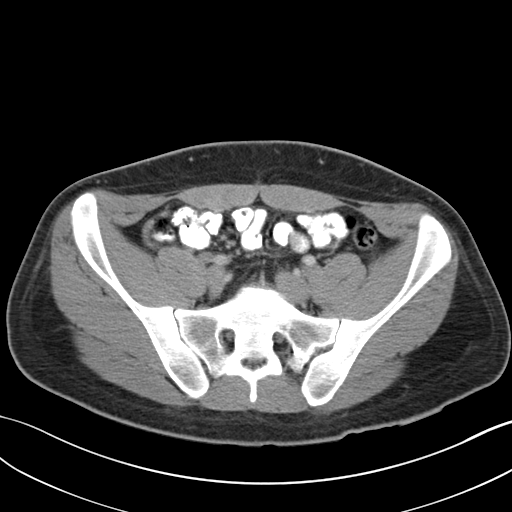
[im 43/103  soft-tissue]
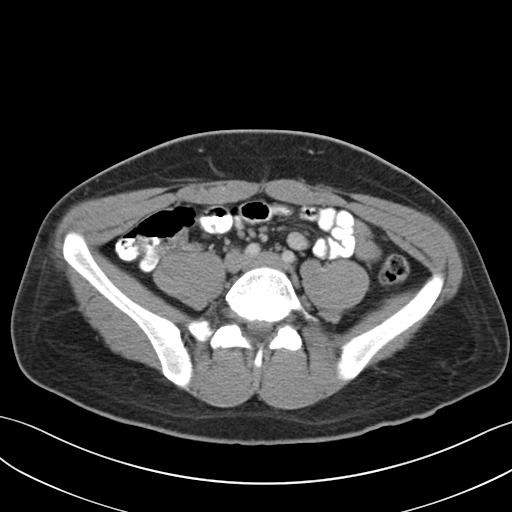
[im 53/103  soft-tissue]
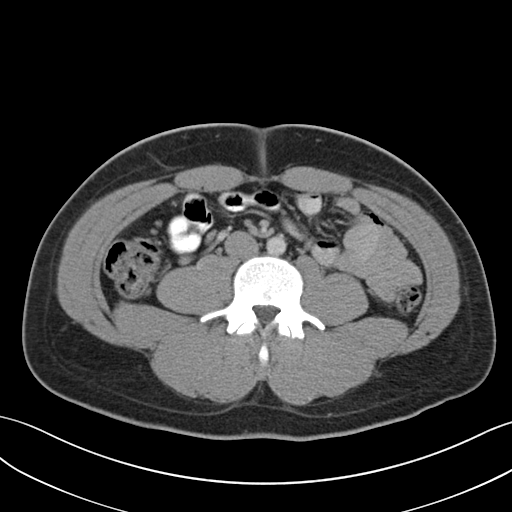
[im 60/103  soft-tissue]
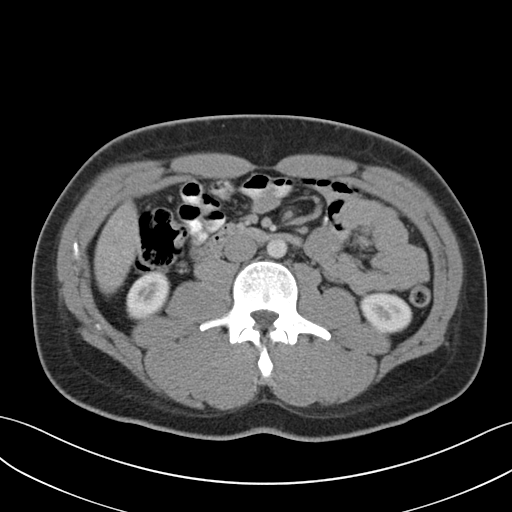
[im 66/103  soft-tissue]
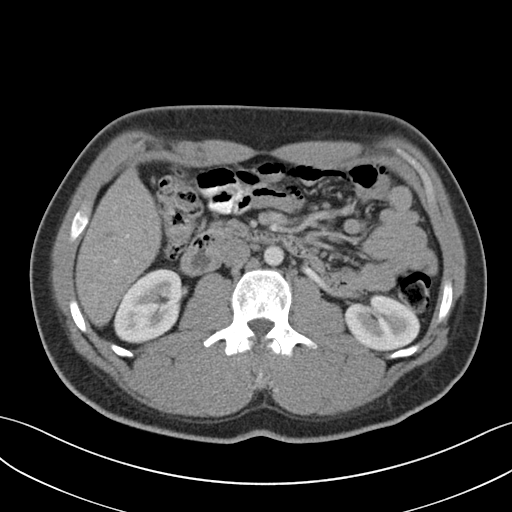
[im 66/103  bone]
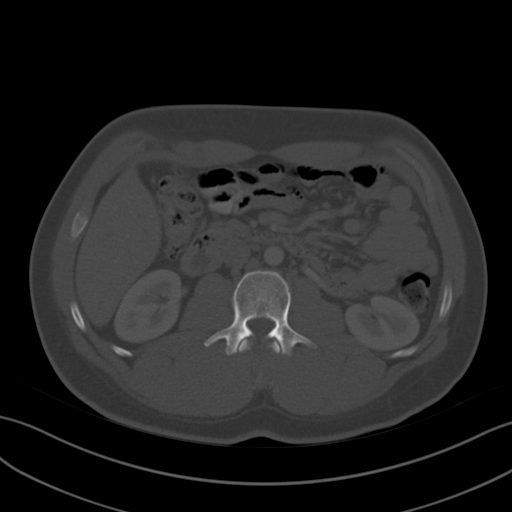
[im 73/103  soft-tissue]
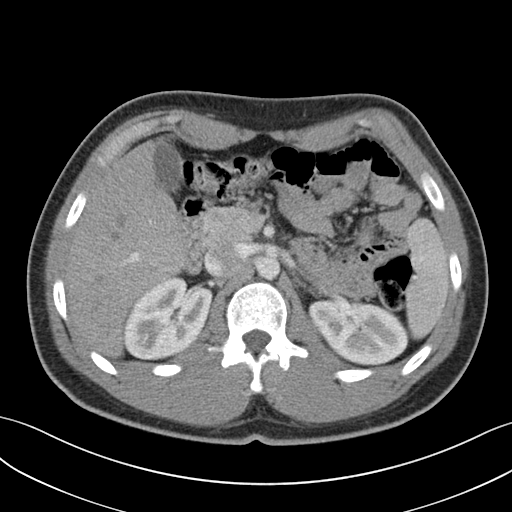
[im 83/103  soft-tissue]
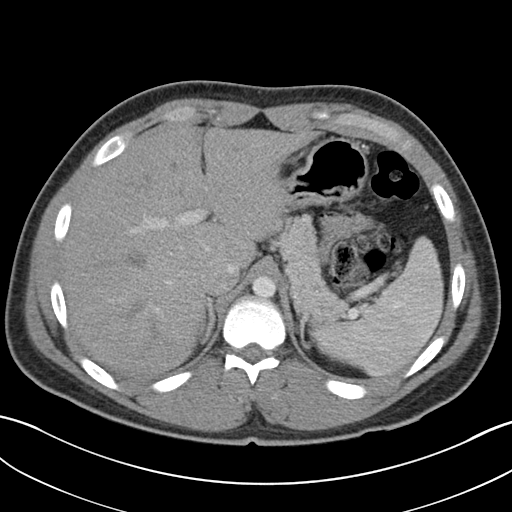
[im 89/103  soft-tissue]
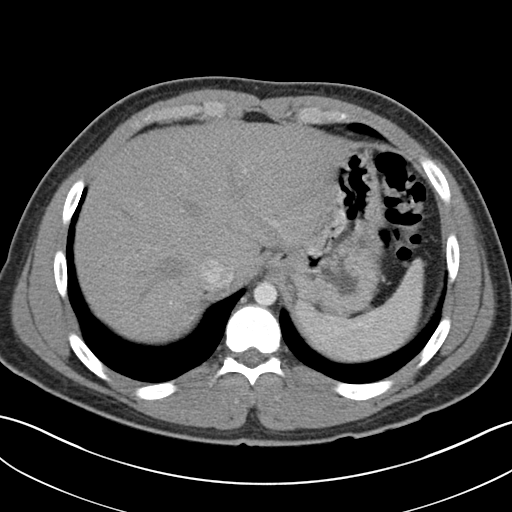
[im 89/103  lung]
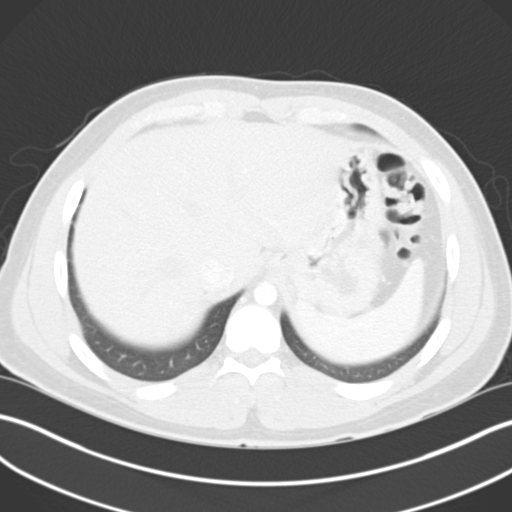
[im 93/103  lung]
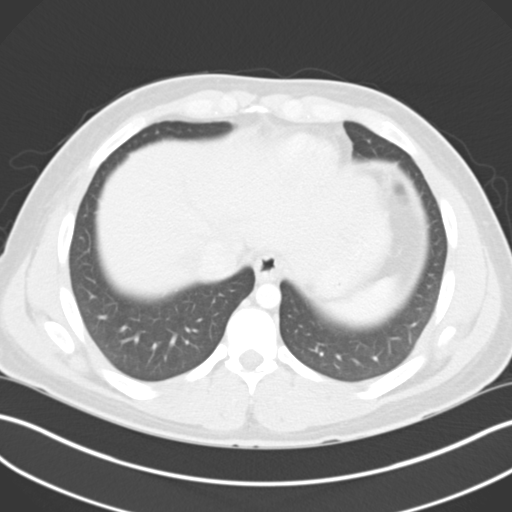
[im 96/103  soft-tissue]
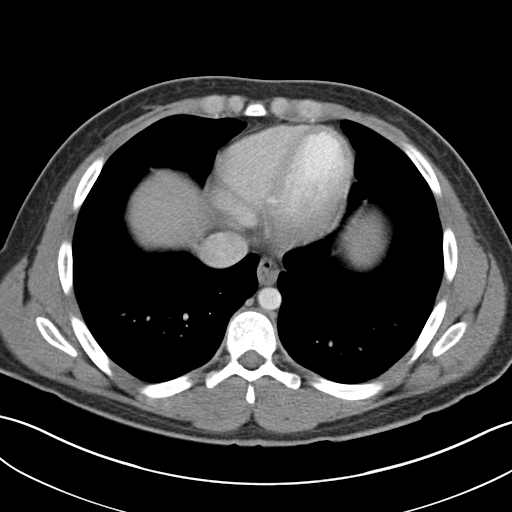
[im 96/103  lung]
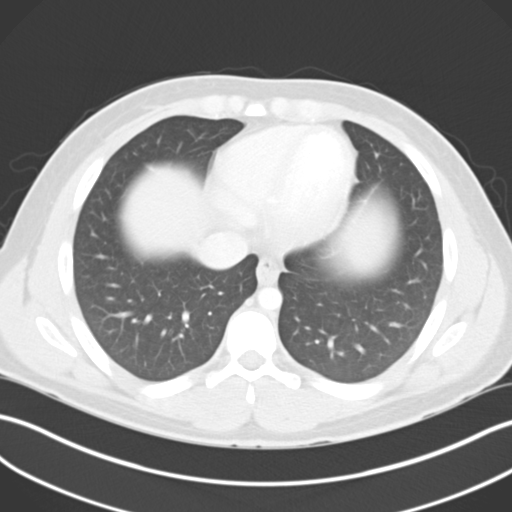
[im 99/103  lung]
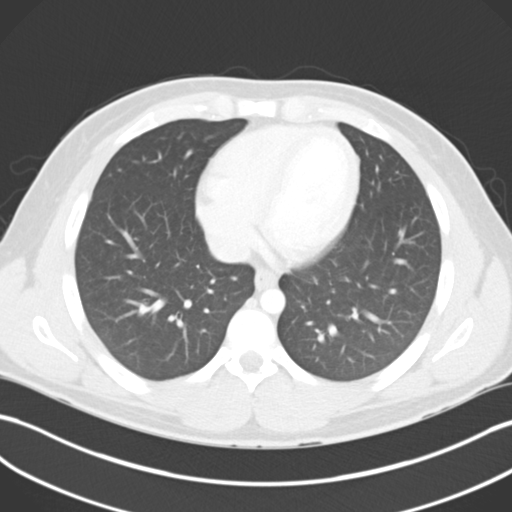

[15 of 32 positions shown; findings below may reference images not displayed]

FINDINGS: Liver:  No masses or other significant abnormality identified.

Gallbladder/Biliary:  Unremarkable.

Pancreas: No mass, inflammatory changes, or other parenchymal
abnormality identified.

Spleen:  Within normal limits in size and appearance.

Adrenal Glands:  No mass identified.

Kidneys/Urinary Tract: No masses identified. No evidence of
hydronephrosis.

Lymph Nodes:  No pathologically enlarged lymph nodes identified.

Pelvic/Reproductive Organs: No mass or other significant abnormality
identified.

Bowel/Peritoneum: No evidence of bowel wall thickening, mass, or
obstruction.

Vascular:  No evidence of abdominal aortic aneurysm.

Musculoskeletal:  No suspicious bone lesions identified.

Other:  None.
IMPRESSION: Negative.
